# Patient Record
Sex: Female | Born: 1970 | Race: White | Marital: Single | Smoking: Former smoker
Health system: Southern US, Community
[De-identification: ages and names within clinical notes are randomized; demographics above are authoritative.]

## PROBLEM LIST (undated history)

## (undated) DIAGNOSIS — R569 Unspecified convulsions: Secondary | ICD-10-CM

## (undated) DIAGNOSIS — Z86718 Personal history of other venous thrombosis and embolism: Secondary | ICD-10-CM

## (undated) DIAGNOSIS — M1990 Unspecified osteoarthritis, unspecified site: Secondary | ICD-10-CM

## (undated) DIAGNOSIS — G35 Multiple sclerosis: Secondary | ICD-10-CM

## (undated) HISTORY — PX: CARPAL TUNNEL RELEASE: SHX101

## (undated) HISTORY — PX: CHOLECYSTECTOMY: SHX55

## (undated) HISTORY — PX: BACK SURGERY: SHX140

---

## 2013-05-10 ENCOUNTER — Emergency Department: Payer: Self-pay | Admitting: Emergency Medicine

## 2013-05-10 LAB — URINALYSIS, COMPLETE
Blood: NEGATIVE
Glucose,UR: NEGATIVE mg/dL (ref 0–75)
Ketone: NEGATIVE
Nitrite: NEGATIVE
Ph: 6 (ref 4.5–8.0)
Protein: NEGATIVE
RBC,UR: 6 /HPF (ref 0–5)
Specific Gravity: 1.03 (ref 1.003–1.030)
Squamous Epithelial: 7
WBC UR: 4 /HPF (ref 0–5)

## 2013-05-10 LAB — COMPREHENSIVE METABOLIC PANEL
Albumin: 3.1 g/dL — ABNORMAL LOW (ref 3.4–5.0)
Alkaline Phosphatase: 95 U/L (ref 50–136)
Calcium, Total: 9.3 mg/dL (ref 8.5–10.1)
Chloride: 106 mmol/L (ref 98–107)
Creatinine: 0.94 mg/dL (ref 0.60–1.30)
EGFR (African American): >60
EGFR (Non-African Amer.): >60
Osmolality: 279 (ref 275–301)
SGOT(AST): 24 U/L (ref 15–37)
SGPT (ALT): 28 U/L (ref 12–78)

## 2013-05-10 LAB — PREGNANCY, URINE: Pregnancy Test, Urine: NEGATIVE m[IU]/mL

## 2013-05-10 LAB — CBC
HGB: 13.8 g/dL (ref 12.0–16.0)
MCH: 30.7 pg (ref 26.0–34.0)
MCHC: 33.9 g/dL (ref 32.0–36.0)
MCV: 90 fL (ref 80–100)
Platelet: 166 10*3/uL (ref 150–440)
RBC: 4.51 10*6/uL (ref 3.80–5.20)
RDW: 14.3 % (ref 11.5–14.5)

## 2014-02-03 ENCOUNTER — Emergency Department (HOSPITAL_COMMUNITY)
Admission: EM | Admit: 2014-02-03 | Discharge: 2014-02-03 | Disposition: A | Discharge status: Home / Self Care | Payer: Self-pay | Attending: Emergency Medicine | Admitting: Emergency Medicine

## 2014-02-03 ENCOUNTER — Encounter (HOSPITAL_COMMUNITY): Payer: Self-pay | Admitting: Emergency Medicine

## 2014-02-03 DIAGNOSIS — Z79899 Other long term (current) drug therapy: Secondary | ICD-10-CM | POA: Insufficient documentation

## 2014-02-03 DIAGNOSIS — Z86718 Personal history of other venous thrombosis and embolism: Secondary | ICD-10-CM | POA: Insufficient documentation

## 2014-02-03 DIAGNOSIS — R109 Unspecified abdominal pain: Secondary | ICD-10-CM | POA: Insufficient documentation

## 2014-02-03 DIAGNOSIS — R197 Diarrhea, unspecified: Secondary | ICD-10-CM | POA: Insufficient documentation

## 2014-02-03 DIAGNOSIS — G40909 Epilepsy, unspecified, not intractable, without status epilepticus: Secondary | ICD-10-CM | POA: Insufficient documentation

## 2014-02-03 DIAGNOSIS — Z7901 Long term (current) use of anticoagulants: Secondary | ICD-10-CM | POA: Insufficient documentation

## 2014-02-03 DIAGNOSIS — R1110 Vomiting, unspecified: Secondary | ICD-10-CM | POA: Insufficient documentation

## 2014-02-03 DIAGNOSIS — M129 Arthropathy, unspecified: Secondary | ICD-10-CM | POA: Insufficient documentation

## 2014-02-03 DIAGNOSIS — Z87891 Personal history of nicotine dependence: Secondary | ICD-10-CM | POA: Insufficient documentation

## 2014-02-03 HISTORY — DX: Unspecified convulsions: R56.9

## 2014-02-03 HISTORY — DX: Multiple sclerosis: G35

## 2014-02-03 HISTORY — DX: Unspecified osteoarthritis, unspecified site: M19.90

## 2014-02-03 HISTORY — DX: Personal history of other venous thrombosis and embolism: Z86.718

## 2014-02-03 LAB — COMPREHENSIVE METABOLIC PANEL
ALBUMIN: 3.2 g/dL — AB (ref 3.5–5.2)
ALT: 23 U/L (ref 0–35)
AST: 17 U/L (ref 0–37)
Alkaline Phosphatase: 79 U/L (ref 39–117)
BUN: 12 mg/dL (ref 6–23)
CHLORIDE: 105 meq/L (ref 96–112)
CO2: 23 mEq/L (ref 19–32)
Calcium: 9.4 mg/dL (ref 8.4–10.5)
Creatinine, Ser: 0.7 mg/dL (ref 0.50–1.10)
GFR calc Af Amer: >90 mL/min (ref >90)
GFR calc non Af Amer: >90 mL/min (ref >90)
Glucose, Bld: 99 mg/dL (ref 70–99)
POTASSIUM: 4.1 meq/L (ref 3.7–5.3)
SODIUM: 142 meq/L (ref 137–147)
TOTAL PROTEIN: 6.6 g/dL (ref 6.0–8.3)

## 2014-02-03 LAB — URINALYSIS, ROUTINE W REFLEX MICROSCOPIC
BILIRUBIN URINE: NEGATIVE
GLUCOSE, UA: NEGATIVE mg/dL
HGB URINE DIPSTICK: NEGATIVE
KETONES UR: NEGATIVE mg/dL
Leukocytes, UA: NEGATIVE
NITRITE: NEGATIVE
Protein, ur: NEGATIVE mg/dL
SPECIFIC GRAVITY, URINE: 1.024 (ref 1.005–1.030)
Urobilinogen, UA: 0.2 mg/dL (ref 0.0–1.0)
pH: 5.5 (ref 5.0–8.0)

## 2014-02-03 LAB — CBC WITH DIFFERENTIAL/PLATELET
BASOS PCT: 0 % (ref 0–1)
Basophils Absolute: 0 10*3/uL (ref 0.0–0.1)
EOS ABS: 0.1 10*3/uL (ref 0.0–0.7)
Eosinophils Relative: 1 % (ref 0–5)
HCT: 39.3 % (ref 36.0–46.0)
HEMOGLOBIN: 13.2 g/dL (ref 12.0–15.0)
Lymphocytes Relative: 39 % (ref 12–46)
Lymphs Abs: 3.2 10*3/uL (ref 0.7–4.0)
MCH: 30.6 pg (ref 26.0–34.0)
MCHC: 33.6 g/dL (ref 30.0–36.0)
MCV: 91.2 fL (ref 78.0–100.0)
MONOS PCT: 7 % (ref 3–12)
Monocytes Absolute: 0.5 10*3/uL (ref 0.1–1.0)
NEUTROS PCT: 53 % (ref 43–77)
Neutro Abs: 4.4 10*3/uL (ref 1.7–7.7)
Platelets: 173 10*3/uL (ref 150–400)
RBC: 4.31 MIL/uL (ref 3.87–5.11)
RDW: 13.9 % (ref 11.5–15.5)
WBC: 8.3 10*3/uL (ref 4.0–10.5)

## 2014-02-03 LAB — PROTIME-INR
INR: 0.97 (ref 0.00–1.49)
Prothrombin Time: 12.7 seconds (ref 11.6–15.2)

## 2014-02-03 LAB — PHENYTOIN LEVEL, TOTAL

## 2014-02-03 LAB — LIPASE, BLOOD: Lipase: 25 U/L (ref 11–59)

## 2014-02-03 MED ORDER — ONDANSETRON HCL 4 MG/2ML IJ SOLN
4.0000 mg | Freq: Once | INTRAMUSCULAR | Status: AC
  Administered 2014-02-03: 4 mg via INTRAVENOUS
  Filled 2014-02-03: qty 2

## 2014-02-03 MED ORDER — PROMETHAZINE HCL 25 MG PO TABS: 25.0000 mg | ORAL_TABLET | Freq: Four times a day (QID) | ORAL | Status: DC | PRN

## 2014-02-03 MED ORDER — FENTANYL CITRATE 0.05 MG/ML IJ SOLN
100.0000 ug | Freq: Once | INTRAMUSCULAR | Status: AC
  Administered 2014-02-03: 100 ug via INTRAVENOUS
  Filled 2014-02-03: qty 2

## 2014-02-03 MED ORDER — SODIUM CHLORIDE 0.9 % IV BOLUS (SEPSIS)
1000.0000 mL | Freq: Once | INTRAVENOUS | Status: AC
  Administered 2014-02-03: 1000 mL via INTRAVENOUS

## 2014-02-03 MED ORDER — HYDROCODONE-ACETAMINOPHEN 5-325 MG PO TABS: 2.0000 | ORAL_TABLET | ORAL | Status: DC | PRN

## 2014-02-03 MED ORDER — SODIUM CHLORIDE 0.9 % IV SOLN
1000.0000 mg | Freq: Once | INTRAVENOUS | Status: AC
  Administered 2014-02-03: 1000 mg via INTRAVENOUS
  Filled 2014-02-03: qty 20

## 2014-02-03 MED ORDER — ENOXAPARIN SODIUM 100 MG/ML ~~LOC~~ SOLN
100.0000 mg | Freq: Once | SUBCUTANEOUS | Status: AC
  Administered 2014-02-03: 100 mg via SUBCUTANEOUS
  Filled 2014-02-03: qty 1

## 2014-02-03 NOTE — ED Notes (Signed)
Pt c/o vomiting and diarrhea x 3 days and intermittent abdominal cramping x 2 days.  Pt takes dilantin and coumadin and has been unable to take her meds for 3 days.  Pt also has been unable to take her pain meds that she takes for her arthritis.

## 2014-02-03 NOTE — ED Provider Notes (Signed)
CSN: [id number]     Arrival date & time 02/03/14  1634 History   First MD Initiated Contact with Patient 02/03/14 1650     Chief Complaint  Patient presents with  . abdominal pain/diarrehea/emesis     HPI Pt c/o vomiting and diarrhea x 3 days and intermittent abdominal cramping x 2 days. Pt takes dilantin and coumadin and has been unable to take her meds for 3 days. Pt also has been unable to take her pain meds that she takes for her arthritis  Past Medical History  Diagnosis Date  . H/O blood clots   . Seizures   . Arthritis   . MS (multiple sclerosis)    Past Surgical History  Procedure Laterality Date  . Back surgery    . Cholecystectomy    . Carpal tunnel release     No family history on file. History  Substance Use Topics  . Smoking status: Former Games developermoker  . Smokeless tobacco: Not on file  . Alcohol Use: No   OB History   Grav Para Term Preterm Abortions TAB SAB Ect Mult Living                 Review of Systems All other systems reviewed and are negative Allergies  Tramadol and Keflex  Home Medications   Current Outpatient Rx  Name  Route  Sig  Dispense  Refill  . cyclobenzaprine (FLEXERIL) 10 MG tablet   Oral   Take 10 mg by mouth 3 (three) times daily as needed for muscle spasms.         Marland Kitchen. oxyCODONE-acetaminophen (PERCOCET/ROXICET) 5-325 MG per tablet   Oral   Take 1 tablet by mouth every 4 (four) hours as needed for severe pain.         . phenytoin (DILANTIN) 100 MG ER capsule   Oral   Take 300 mg by mouth 2 (two) times daily.         Marland Kitchen. warfarin (COUMADIN) 2 MG tablet   Oral   Take 6 mg by mouth daily. Pt takes the 2 mg tablets.  Takes 3 tablets for 6 mg dose         . HYDROcodone-acetaminophen (NORCO/VICODIN) 5-325 MG per tablet   Oral   Take 2 tablets by mouth every 4 (four) hours as needed.   10 tablet   0   . promethazine (PHENERGAN) 25 MG tablet   Oral   Take 1 tablet (25 mg total) by mouth every 6 (six) hours as needed for nausea  or vomiting.   30 tablet   0    BP 99/58  Pulse 78  Temp(Src) 98.4 F (36.9 C) (Oral)  Resp 25  Wt 230 lb 6 oz (104.497 kg)  SpO2 98%  LMP 01/15/2014 Physical Exam  Nursing note and vitals reviewed. Constitutional: She is oriented to person, place, and time. She appears well-developed and well-nourished. No distress.  HENT:  Head: Normocephalic and atraumatic.  Eyes: Pupils are equal, round, and reactive to light.  Neck: Normal range of motion.  Cardiovascular: Normal rate and intact distal pulses.   Pulmonary/Chest: No respiratory distress.  Abdominal: Normal appearance. She exhibits no distension. There is no tenderness. There is no rebound and no guarding.  Musculoskeletal: Normal range of motion.  Neurological: She is alert and oriented to person, place, and time. No cranial nerve deficit.  Skin: Skin is warm and dry. No rash noted.  Psychiatric: She has a normal mood and affect. Her behavior is  normal.    ED Course  Procedures (including critical care time)  Medications  fentaNYL (SUBLIMAZE) injection 100 mcg (100 mcg Intravenous Given 02/03/14 1733)  ondansetron (ZOFRAN) injection 4 mg (4 mg Intravenous Given 02/03/14 1733)  sodium chloride 0.9 % bolus 1,000 mL (0 mLs Intravenous Stopped 02/03/14 1831)  fentaNYL (SUBLIMAZE) injection 100 mcg (100 mcg Intravenous Given 02/03/14 1845)  phenytoin (DILANTIN) 1,000 mg in sodium chloride 0.9 % 250 mL IVPB (0 mg Intravenous Stopped 02/03/14 1958)  enoxaparin (LOVENOX) injection 100 mg (100 mg Subcutaneous Given 02/03/14 2025)    Labs Review Labs Reviewed  COMPREHENSIVE METABOLIC PANEL - Abnormal; Notable for the following:    Albumin 3.2 (*)    Total Bilirubin <0.2 (*)    All other components within normal limits  PHENYTOIN LEVEL, TOTAL - Abnormal; Notable for the following:    Phenytoin Lvl <2.5 (*)    All other components within normal limits  CBC WITH DIFFERENTIAL  LIPASE, BLOOD  PROTIME-INR  URINALYSIS, ROUTINE W REFLEX  MICROSCOPIC   Imaging Review No results found.  Patient refused to be admitted to the hospital.  She wants to go home and will return if her vomiting continues.  We did load her with Dilantin and treat her with Lovenox prior to discharge.  I did prescribe Phenergan for her nausea.  Her labs look unremarkable and hopefully this is a gastroenteritis..  I invited her to return to the emergency room for admission if her symptoms persist or vomiting can be controlled.  In  MDM   1. Vomiting   2. Seizure disorder        Nelia Shi, MD 02/03/14 2129

## 2014-02-03 NOTE — Discharge Instructions (Signed)
Nausea and Vomiting °Nausea means you feel sick to your stomach. Throwing up (vomiting) is a reflex where stomach contents come out of your mouth. °HOME CARE  °· Take medicine as told by your doctor. °· Do not force yourself to eat. However, you do need to drink fluids. °· If you feel like eating, eat a normal diet as told by your doctor. °· Eat rice, wheat, potatoes, bread, lean meats, yogurt, fruits, and vegetables. °· Avoid high-fat foods. °· Drink enough fluids to keep your pee (urine) clear or pale yellow. °· Ask your doctor how to replace body fluid losses (rehydrate). Signs of body fluid loss (dehydration) include: °· Feeling very thirsty. °· Dry lips and mouth. °· Feeling dizzy. °· Dark pee. °· Peeing less than normal. °· Feeling confused. °· Fast breathing or heart rate. °GET HELP RIGHT AWAY IF:  °· You have blood in your throw up. °· You have black or bloody poop (stool). °· You have a bad headache or stiff neck. °· You feel confused. °· You have bad belly (abdominal) pain. °· You have chest pain or trouble breathing. °· You do not pee at least once every 8 hours. °· You have cold, clammy skin. °· You keep throwing up after 24 to 48 hours. °· You have a fever. °MAKE SURE YOU:  °· Understand these instructions. °· Will watch your condition. °· Will get help right away if you are not doing well or get worse. °Document Released: 06/02/2008 Document Revised: 03/08/2012 Document Reviewed: 05/16/2011 °[employer]® Patient Information ©2014 [employer]. ° °

## 2014-02-19 ENCOUNTER — Encounter (HOSPITAL_COMMUNITY): Payer: Self-pay | Admitting: Emergency Medicine

## 2014-02-19 ENCOUNTER — Emergency Department (HOSPITAL_COMMUNITY)
Admission: EM | Admit: 2014-02-19 | Discharge: 2014-02-19 | Disposition: A | Discharge status: Home / Self Care | Payer: Self-pay | Attending: Emergency Medicine | Admitting: Emergency Medicine

## 2014-02-19 DIAGNOSIS — Z79899 Other long term (current) drug therapy: Secondary | ICD-10-CM | POA: Insufficient documentation

## 2014-02-19 DIAGNOSIS — N949 Unspecified condition associated with female genital organs and menstrual cycle: Secondary | ICD-10-CM | POA: Insufficient documentation

## 2014-02-19 DIAGNOSIS — M129 Arthropathy, unspecified: Secondary | ICD-10-CM | POA: Insufficient documentation

## 2014-02-19 DIAGNOSIS — Z3202 Encounter for pregnancy test, result negative: Secondary | ICD-10-CM | POA: Insufficient documentation

## 2014-02-19 DIAGNOSIS — R102 Pelvic and perineal pain: Secondary | ICD-10-CM

## 2014-02-19 DIAGNOSIS — Z87891 Personal history of nicotine dependence: Secondary | ICD-10-CM | POA: Insufficient documentation

## 2014-02-19 DIAGNOSIS — Z7901 Long term (current) use of anticoagulants: Secondary | ICD-10-CM | POA: Insufficient documentation

## 2014-02-19 DIAGNOSIS — Z86718 Personal history of other venous thrombosis and embolism: Secondary | ICD-10-CM | POA: Insufficient documentation

## 2014-02-19 DIAGNOSIS — R112 Nausea with vomiting, unspecified: Secondary | ICD-10-CM | POA: Insufficient documentation

## 2014-02-19 DIAGNOSIS — G40909 Epilepsy, unspecified, not intractable, without status epilepticus: Secondary | ICD-10-CM | POA: Insufficient documentation

## 2014-02-19 DIAGNOSIS — N938 Other specified abnormal uterine and vaginal bleeding: Secondary | ICD-10-CM | POA: Insufficient documentation

## 2014-02-19 DIAGNOSIS — N925 Other specified irregular menstruation: Secondary | ICD-10-CM | POA: Insufficient documentation

## 2014-02-19 LAB — CBC
HCT: 39.3 % (ref 36.0–46.0)
Hemoglobin: 13.5 g/dL (ref 12.0–15.0)
MCH: 31.2 pg (ref 26.0–34.0)
MCHC: 34.4 g/dL (ref 30.0–36.0)
MCV: 90.8 fL (ref 78.0–100.0)
PLATELETS: 144 10*3/uL — AB (ref 150–400)
RBC: 4.33 MIL/uL (ref 3.87–5.11)
RDW: 14.2 % (ref 11.5–15.5)
WBC: 6.3 10*3/uL (ref 4.0–10.5)

## 2014-02-19 LAB — BASIC METABOLIC PANEL
BUN: 10 mg/dL (ref 6–23)
CALCIUM: 8.9 mg/dL (ref 8.4–10.5)
CHLORIDE: 104 meq/L (ref 96–112)
CO2: 24 mEq/L (ref 19–32)
CREATININE: 0.7 mg/dL (ref 0.50–1.10)
GFR calc Af Amer: >90 mL/min (ref >90)
GFR calc non Af Amer: >90 mL/min (ref >90)
Glucose, Bld: 91 mg/dL (ref 70–99)
Potassium: 4.3 mEq/L (ref 3.7–5.3)
SODIUM: 141 meq/L (ref 137–147)

## 2014-02-19 LAB — URINALYSIS, ROUTINE W REFLEX MICROSCOPIC
Bilirubin Urine: NEGATIVE
Glucose, UA: NEGATIVE mg/dL
Ketones, ur: NEGATIVE mg/dL
NITRITE: NEGATIVE
PROTEIN: NEGATIVE mg/dL
SPECIFIC GRAVITY, URINE: 1.021 (ref 1.005–1.030)
UROBILINOGEN UA: 0.2 mg/dL (ref 0.0–1.0)
pH: 6.5 (ref 5.0–8.0)

## 2014-02-19 LAB — POC URINE PREG, ED: PREG TEST UR: NEGATIVE

## 2014-02-19 LAB — URINE MICROSCOPIC-ADD ON

## 2014-02-19 LAB — WET PREP, GENITAL
Clue Cells Wet Prep HPF POC: NONE SEEN
Trich, Wet Prep: NONE SEEN
Yeast Wet Prep HPF POC: NONE SEEN

## 2014-02-19 MED ORDER — MORPHINE SULFATE 4 MG/ML IJ SOLN
4.0000 mg | Freq: Once | INTRAMUSCULAR | Status: AC
  Administered 2014-02-19: 4 mg via INTRAVENOUS
  Filled 2014-02-19: qty 1

## 2014-02-19 MED ORDER — ONDANSETRON HCL 4 MG/2ML IJ SOLN
4.0000 mg | Freq: Once | INTRAMUSCULAR | Status: AC
  Administered 2014-02-19: 4 mg via INTRAVENOUS
  Filled 2014-02-19: qty 2

## 2014-02-19 MED ORDER — HYDROCODONE-ACETAMINOPHEN 5-325 MG PO TABS: 1.0000 | ORAL_TABLET | ORAL | Status: DC | PRN

## 2014-02-19 MED ORDER — HYDROMORPHONE HCL PF 1 MG/ML IJ SOLN
1.0000 mg | Freq: Once | INTRAMUSCULAR | Status: DC
  Filled 2014-02-19: qty 1

## 2014-02-19 MED ORDER — HYDROCODONE-ACETAMINOPHEN 5-325 MG PO TABS
1.0000 | ORAL_TABLET | Freq: Once | ORAL | Status: AC
  Administered 2014-02-19: 1 via ORAL
  Filled 2014-02-19: qty 1

## 2014-02-19 NOTE — Discharge Instructions (Signed)
Call for a follow up appointment with a Family or Primary Care Provider.  Call an OB/GYN for further evaluation of your ovarian cyst and pelvic pain. Return if Symptoms worsen.   Take medication as prescribed.

## 2014-02-19 NOTE — ED Notes (Signed)
Per pt sts severe LLQ pain that started yesterday with her period,. sts hx of ovarian cysts. sts some vomiting

## 2014-02-19 NOTE — ED Notes (Signed)
Pt refused vitals. D/c her own IV. States she needs to leave.

## 2014-02-19 NOTE — ED Provider Notes (Signed)
CSN: [id number]     Arrival date & time 02/19/14  1456 History   First MD Initiated Contact with Patient 02/19/14 1519     Chief Complaint  Patient presents with  . Abdominal Pain     (Consider location/radiation/quality/duration/timing/severity/associated sxs/prior Treatment) HPI Comments: Heather Odom is a 43 year-old female with a past medical history of ovarian cysts, Arthritis, MS and seizure disorder, presenting the Emergency Department with a chief complaint of LLQ pain since yesterday.  The patient reports sharp, non-radiating, constant discomfort in the left lower quadrant.  She reports associated nausea with emesis today.  She reports non-bloody BM today.  She reports LNMP 02/18/2014.  She reports similar pain with ovarian cysts in the past.  NO PCP   The history is provided by the patient and medical records. No language interpreter was used.    Past Medical History  Diagnosis Date  . H/O blood clots   . Seizures   . Arthritis   . MS (multiple sclerosis)    Past Surgical History  Procedure Laterality Date  . Back surgery    . Cholecystectomy    . Carpal tunnel release     History reviewed. No pertinent family history. History  Substance Use Topics  . Smoking status: Former Games developer  . Smokeless tobacco: Not on file  . Alcohol Use: No   OB History   Grav Para Term Preterm Abortions TAB SAB Ect Mult Living                 Review of Systems  Constitutional: Negative for fever and chills.  Gastrointestinal: Positive for nausea, vomiting and abdominal pain. Negative for diarrhea and constipation.  Genitourinary: Positive for menstrual problem and pelvic pain. Negative for dysuria, hematuria and difficulty urinating.      Allergies  Shellfish allergy; Tramadol; and Keflex  Home Medications   Current Outpatient Rx  Name  Route  Sig  Dispense  Refill  . cyclobenzaprine (FLEXERIL) 10 MG tablet   Oral   Take 10 mg by mouth 3 (three) times daily as needed for  muscle spasms.         Marland Kitchen HYDROcodone-acetaminophen (NORCO/VICODIN) 5-325 MG per tablet   Oral   Take 1 tablet by mouth every 6 (six) hours as needed for moderate pain.         Marland Kitchen ibuprofen (ADVIL,MOTRIN) 200 MG tablet   Oral   Take 400 mg by mouth every 6 (six) hours as needed for mild pain.         Marland Kitchen oxyCODONE-acetaminophen (PERCOCET/ROXICET) 5-325 MG per tablet   Oral   Take 1 tablet by mouth every 4 (four) hours as needed for severe pain.         . phenytoin (DILANTIN) 100 MG ER capsule   Oral   Take 300 mg by mouth 2 (two) times daily.         . promethazine (PHENERGAN) 25 MG tablet   Oral   Take 25 mg by mouth every 6 (six) hours as needed for nausea or vomiting.         . warfarin (COUMADIN) 2 MG tablet   Oral   Take 6 mg by mouth daily.          Marland Kitchen HYDROcodone-acetaminophen (NORCO/VICODIN) 5-325 MG per tablet   Oral   Take 1 tablet by mouth every 4 (four) hours as needed.   10 tablet   0    BP 114/74  Pulse 66  Temp(Src) 99 F (  37.2 C) (Oral)  Resp 20  SpO2 97%  LMP 02/19/2014 Physical Exam  Nursing note and vitals reviewed. Constitutional: She is oriented to person, place, and time. She appears well-developed and well-nourished. No distress.  Exam limited by patient's body habitus.    HENT:  Head: Normocephalic and atraumatic.  Eyes: EOM are normal. Pupils are equal, round, and reactive to light. No scleral icterus.  Neck: Neck supple.  Cardiovascular: Normal rate, regular rhythm and normal heart sounds.   No murmur heard. Pulmonary/Chest: Effort normal and breath sounds normal. No respiratory distress. She has no decreased breath sounds. She has no wheezes. She has no rhonchi.  Abdominal: Soft. Bowel sounds are normal. There is tenderness in the left lower quadrant. There is no rebound, no guarding and no CVA tenderness.  Genitourinary: Uterus is not tender. Cervix exhibits no motion tenderness. Right adnexum displays no tenderness. Left  adnexum displays tenderness.  Moderate amount of dark blood in the posterior vaginal vault. Left adnexa with moderate tenderness to palpation no obvious mass.  Musculoskeletal: Normal range of motion. She exhibits no edema.  Neurological: She is alert and oriented to person, place, and time.  Skin: Skin is warm and dry. No rash noted.  Psychiatric: She has a normal mood and affect.    ED Course  Procedures (including critical care time) Labs Review Labs Reviewed  WET PREP, GENITAL - Abnormal; Notable for the following:    WBC, Wet Prep HPF POC FEW (*)    All other components within normal limits  URINALYSIS, ROUTINE W REFLEX MICROSCOPIC - Abnormal; Notable for the following:    APPearance CLOUDY (*)    Hgb urine dipstick LARGE (*)    Leukocytes, UA SMALL (*)    All other components within normal limits  CBC - Abnormal; Notable for the following:    Platelets 144 (*)    All other components within normal limits  GC/CHLAMYDIA PROBE AMP  BASIC METABOLIC PANEL  URINE MICROSCOPIC-ADD ON  POC URINE PREG, ED   Imaging Review No results found.  EKG Interpretation   None       MDM   Final diagnoses:  Pelvic pain   Pt with a history of pelvic pain presents with LLQ pain.  Negative pregnancy test. Afebrile, no leukocytois doubt diverticulitis at this time. Doubt ovarian torsion at this time. UA shows Hgb, currently menstruating. Pelvic exam without adnexa mass.  BMP WNL.  Re-eval: Pt reports some relief with morphine.  Morphine 4mg  #2 ordered.  Wet prep without yeast, trich, clue cells. Pain likely from cysts, pain control and follow up with OB/GYN. Discussed lab results, and treatment plan with the patient. Return precautions given. Reports understanding and no other concerns at this time.  Patient is stable for discharge at this time.  Meds given in ED:  Medications  HYDROmorphone (DILAUDID) injection 1 mg (not administered)  morphine 4 MG/ML injection 4 mg (4 mg Intravenous  Given 02/19/14 1623)  ondansetron (ZOFRAN) injection 4 mg (4 mg Intravenous Given 02/19/14 1623)  morphine 4 MG/ML injection 4 mg (4 mg Intravenous Given 02/19/14 1722)    New Prescriptions   HYDROCODONE-ACETAMINOPHEN (NORCO/VICODIN) 5-325 MG PER TABLET    Take 1 tablet by mouth every 4 (four) hours as needed.      Clabe SealLauren M Oreatha Fabry, PA-C 02/19/14 1807

## 2014-02-19 NOTE — ED Notes (Signed)
PA at bedside.

## 2014-02-19 NOTE — ED Notes (Signed)
Pt reports she has a h/o ovarian cysts and the pain is similar.

## 2014-02-20 LAB — GC/CHLAMYDIA PROBE AMP
CT PROBE, AMP APTIMA: NEGATIVE
GC PROBE AMP APTIMA: NEGATIVE

## 2014-02-20 NOTE — ED Provider Notes (Signed)
Medical screening examination/treatment/procedure(s) were conducted as a shared visit with non-physician practitioner(s) and myself.  I personally evaluated the patient during the encounter.  EKG Interpretation   None       Pt states hx ovarian cysts, c/o right lower abd/pelvis pain c/w prior cyst related pain. No fever or chills. abd soft mild llq tenderness. No rebound or guarding. Labs. Pelvic exam per PA.   Suzi RootsKevin E Makell Cyr, MD 02/20/14 1153

## 2014-03-02 ENCOUNTER — Ambulatory Visit (INDEPENDENT_AMBULATORY_CARE_PROVIDER_SITE_OTHER): Payer: Self-pay | Admitting: Obstetrics & Gynecology

## 2014-03-02 ENCOUNTER — Encounter: Payer: Self-pay | Admitting: Obstetrics & Gynecology

## 2014-03-02 VITALS — BP 127/81 | HR 97 | Temp 98.1°F | Ht 62.0 in | Wt 233.4 lb

## 2014-03-02 DIAGNOSIS — R102 Pelvic and perineal pain: Secondary | ICD-10-CM

## 2014-03-02 DIAGNOSIS — N949 Unspecified condition associated with female genital organs and menstrual cycle: Secondary | ICD-10-CM

## 2014-03-02 NOTE — Patient Instructions (Signed)

## 2014-03-02 NOTE — Progress Notes (Signed)
Subjective:     Patient ID: Heather Odom, female   DOB: 04/26/1971, 43 y.o.   MRN: [id number]  HPI Pt was seen in the ED for pelvic pain.  She reported to them that she was previously told that she needed a partial hysterectomy for ovarian cysts'.  She reports that the pain that she had in theED has resolved. She does report heavy menses with no Intermenstrual bleeding.  Past Medical History  Diagnosis Date  . H/O blood clots   . Seizures   . Arthritis   . MS (multiple sclerosis)    Past Surgical History  Procedure Laterality Date  . Back surgery    . Cholecystectomy    . Carpal tunnel release     Current Outpatient Prescriptions on File Prior to Visit  Medication Sig Dispense Refill  . cyclobenzaprine (FLEXERIL) 10 MG tablet Take 10 mg by mouth 3 (three) times daily as needed for muscle spasms.      Marland Kitchen. HYDROcodone-acetaminophen (NORCO/VICODIN) 5-325 MG per tablet Take 1 tablet by mouth every 4 (four) hours as needed.  10 tablet  0  . ibuprofen (ADVIL,MOTRIN) 200 MG tablet Take 400 mg by mouth every 6 (six) hours as needed for mild pain.      Marland Kitchen. oxyCODONE-acetaminophen (PERCOCET/ROXICET) 5-325 MG per tablet Take 1 tablet by mouth every 4 (four) hours as needed for severe pain.      . phenytoin (DILANTIN) 100 MG ER capsule Take 300 mg by mouth 2 (two) times daily.      Marland Kitchen. warfarin (COUMADIN) 2 MG tablet Take 6 mg by mouth daily.        No current facility-administered medications on file prior to visit.   Allergies  Allergen Reactions  . Shellfish Allergy Anaphylaxis  . Tramadol Nausea And Vomiting  . Keflex [Cephalexin] Rash        Review of Systems     Objective:   Physical Exam BP 127/81  Pulse 97  Temp(Src) 98.1 F (36.7 C) (Oral)  Ht 5\' 2"  (1.575 m)  Wt 233 lb 6.4 oz (105.87 kg)  BMI 42.68 kg/m2  LMP 02/19/2014 Pt in NAD Abd: obese, NT; ND GU: EGBUS: no lesions Vagina: no blood in vault Cervix: no lesion; no mucopurulent d/c Uterus: small, mobile Adnexa: no  masses; non tender- exam limited by body habitus    CBC    Component Value Date/Time   WBC 6.3 02/19/2014 1600   RBC 4.33 02/19/2014 1600   HGB 13.5 02/19/2014 1600   HCT 39.3 02/19/2014 1600   PLT 144* 02/19/2014 1600   MCV 90.8 02/19/2014 1600   MCH 31.2 02/19/2014 1600   MCHC 34.4 02/19/2014 1600   RDW 14.2 02/19/2014 1600   LYMPHSABS 3.2 02/03/2014 1735   MONOABS 0.5 02/03/2014 1735   EOSABS 0.1 02/03/2014 1735   BASOSABS 0.0 02/03/2014 1735          Assessment:     Pelvic pain 02/19/2014 which has resolved completely.     Plan:     F/u in 1 year or sooner prn

## 2014-04-11 ENCOUNTER — Encounter (HOSPITAL_COMMUNITY): Payer: Self-pay | Admitting: Emergency Medicine

## 2014-04-11 ENCOUNTER — Emergency Department (HOSPITAL_COMMUNITY)
Admission: EM | Admit: 2014-04-11 | Discharge: 2014-04-11 | Disposition: A | Discharge status: Home / Self Care | Payer: Self-pay | Attending: Emergency Medicine | Admitting: Emergency Medicine

## 2014-04-11 ENCOUNTER — Emergency Department (HOSPITAL_COMMUNITY): Payer: Self-pay

## 2014-04-11 DIAGNOSIS — G40909 Epilepsy, unspecified, not intractable, without status epilepticus: Secondary | ICD-10-CM | POA: Insufficient documentation

## 2014-04-11 DIAGNOSIS — Z79899 Other long term (current) drug therapy: Secondary | ICD-10-CM | POA: Insufficient documentation

## 2014-04-11 DIAGNOSIS — Z7901 Long term (current) use of anticoagulants: Secondary | ICD-10-CM | POA: Insufficient documentation

## 2014-04-11 DIAGNOSIS — N83209 Unspecified ovarian cyst, unspecified side: Secondary | ICD-10-CM | POA: Insufficient documentation

## 2014-04-11 DIAGNOSIS — Z8739 Personal history of other diseases of the musculoskeletal system and connective tissue: Secondary | ICD-10-CM | POA: Insufficient documentation

## 2014-04-11 DIAGNOSIS — Z3202 Encounter for pregnancy test, result negative: Secondary | ICD-10-CM | POA: Insufficient documentation

## 2014-04-11 DIAGNOSIS — Z87891 Personal history of nicotine dependence: Secondary | ICD-10-CM | POA: Insufficient documentation

## 2014-04-11 DIAGNOSIS — Z86718 Personal history of other venous thrombosis and embolism: Secondary | ICD-10-CM | POA: Insufficient documentation

## 2014-04-11 LAB — CBC WITH DIFFERENTIAL/PLATELET
BASOS PCT: 0 % (ref 0–1)
Basophils Absolute: 0 10*3/uL (ref 0.0–0.1)
Eosinophils Absolute: 0.2 10*3/uL (ref 0.0–0.7)
Eosinophils Relative: 2 % (ref 0–5)
HCT: 39.7 % (ref 36.0–46.0)
Hemoglobin: 13.6 g/dL (ref 12.0–15.0)
LYMPHS ABS: 4.1 10*3/uL — AB (ref 0.7–4.0)
Lymphocytes Relative: 44 % (ref 12–46)
MCH: 31.2 pg (ref 26.0–34.0)
MCHC: 34.3 g/dL (ref 30.0–36.0)
MCV: 91.1 fL (ref 78.0–100.0)
MONOS PCT: 6 % (ref 3–12)
Monocytes Absolute: 0.6 10*3/uL (ref 0.1–1.0)
Neutro Abs: 4.5 10*3/uL (ref 1.7–7.7)
Neutrophils Relative %: 48 % (ref 43–77)
Platelets: 179 10*3/uL (ref 150–400)
RBC: 4.36 MIL/uL (ref 3.87–5.11)
RDW: 13.9 % (ref 11.5–15.5)
WBC: 9.3 10*3/uL (ref 4.0–10.5)

## 2014-04-11 LAB — URINALYSIS, ROUTINE W REFLEX MICROSCOPIC
Bilirubin Urine: NEGATIVE
GLUCOSE, UA: NEGATIVE mg/dL
KETONES UR: NEGATIVE mg/dL
LEUKOCYTES UA: NEGATIVE
NITRITE: NEGATIVE
PROTEIN: NEGATIVE mg/dL
Specific Gravity, Urine: 1.022 (ref 1.005–1.030)
UROBILINOGEN UA: 0.2 mg/dL (ref 0.0–1.0)
pH: 6 (ref 5.0–8.0)

## 2014-04-11 LAB — COMPREHENSIVE METABOLIC PANEL
ALT: 16 U/L (ref 0–35)
AST: 17 U/L (ref 0–37)
Albumin: 3.1 g/dL — ABNORMAL LOW (ref 3.5–5.2)
Alkaline Phosphatase: 76 U/L (ref 39–117)
BUN: 13 mg/dL (ref 6–23)
CHLORIDE: 102 meq/L (ref 96–112)
CO2: 26 mEq/L (ref 19–32)
Calcium: 9.2 mg/dL (ref 8.4–10.5)
Creatinine, Ser: 0.82 mg/dL (ref 0.50–1.10)
GFR calc non Af Amer: 88 mL/min — ABNORMAL LOW (ref >90)
GLUCOSE: 105 mg/dL — AB (ref 70–99)
Potassium: 4.3 mEq/L (ref 3.7–5.3)
Sodium: 140 mEq/L (ref 137–147)
TOTAL PROTEIN: 6.6 g/dL (ref 6.0–8.3)

## 2014-04-11 LAB — URINE MICROSCOPIC-ADD ON

## 2014-04-11 LAB — PROTIME-INR
INR: 0.96 (ref 0.00–1.49)
Prothrombin Time: 12.6 seconds (ref 11.6–15.2)

## 2014-04-11 LAB — POC URINE PREG, ED: PREG TEST UR: NEGATIVE

## 2014-04-11 MED ORDER — ONDANSETRON 4 MG PO TBDP: ORAL_TABLET | ORAL | Status: AC

## 2014-04-11 MED ORDER — ONDANSETRON 4 MG PO TBDP
4.0000 mg | ORAL_TABLET | Freq: Once | ORAL | Status: AC
  Administered 2014-04-11: 4 mg via ORAL
  Filled 2014-04-11: qty 1

## 2014-04-11 MED ORDER — HYDROMORPHONE HCL PF 1 MG/ML IJ SOLN
2.0000 mg | Freq: Once | INTRAMUSCULAR | Status: AC
  Administered 2014-04-11: 2 mg via INTRAMUSCULAR
  Filled 2014-04-11: qty 2

## 2014-04-11 MED ORDER — HYDROCODONE-ACETAMINOPHEN 5-325 MG PO TABS: 1.0000 | ORAL_TABLET | ORAL | Status: AC | PRN

## 2014-04-11 NOTE — Discharge Instructions (Signed)

## 2014-04-11 NOTE — ED Notes (Signed)
Pt provided coke and crackers for discharge.

## 2014-04-11 NOTE — ED Provider Notes (Signed)
CSN: [id number]     Arrival date & time 04/11/14  [id number] History   First MD Initiated Contact with Patient 04/11/14 [phone]     Chief Complaint  Patient presents with  . Abdominal Pain     (Consider location/radiation/quality/duration/timing/severity/associated sxs/prior Treatment) HPI Patient has a history of ovarian cysts typically cause symptoms in the beginning of her menstrual cycle. She states she both started her menstrual cycle and started having left-sided pelvic pain yesterday. The pain has been constant and progressive. She's had no fever or chills. Patient does admit to nausea and one episode of vomiting. She's had no diarrhea or constipation. Past Medical History  Diagnosis Date  . H/O blood clots   . Seizures   . Arthritis   . MS (multiple sclerosis)    Past Surgical History  Procedure Laterality Date  . Back surgery    . Cholecystectomy    . Carpal tunnel release     No family history on file. History  Substance Use Topics  . Smoking status: Former Smoker    Types: Cigarettes  . Smokeless tobacco: Not on file  . Alcohol Use: No   OB History   Grav Para Term Preterm Abortions TAB SAB Ect Mult Living   1 1 1  0 0 0 0 0 0 1     Review of Systems  Constitutional: Negative for fever and chills.  Respiratory: Negative for cough and shortness of breath.   Cardiovascular: Negative for chest pain.  Gastrointestinal: Positive for nausea, vomiting and abdominal pain. Negative for diarrhea and constipation.  Genitourinary: Positive for vaginal bleeding and pelvic pain. Negative for dysuria, flank pain and vaginal discharge.  Musculoskeletal: Negative for back pain, neck pain and neck stiffness.  Skin: Negative for rash and wound.  Neurological: Negative for dizziness, seizures, weakness, light-headedness, numbness and headaches.  All other systems reviewed and are negative.     Allergies  Shellfish allergy; Tramadol; and Keflex  Home Medications   Current  Outpatient Rx  Name  Route  Sig  Dispense  Refill  . phenytoin (DILANTIN) 100 MG ER capsule   Oral   Take 300 mg by mouth 2 (two) times daily.         Marland Kitchen. warfarin (COUMADIN) 2 MG tablet   Oral   Take 6 mg by mouth daily.           BP 121/108  Pulse 95  Temp(Src) 99 F (37.2 C) (Oral)  Resp 22  Ht 5\' 2"  (1.575 m)  Wt 220 lb (99.791 kg)  BMI 40.23 kg/m2  SpO2 100%  LMP 04/10/2014 Physical Exam  Nursing note and vitals reviewed. Constitutional: She is oriented to person, place, and time. She appears well-developed and well-nourished. No distress.  HENT:  Head: Normocephalic and atraumatic.  Mouth/Throat: Oropharynx is clear and moist.  Eyes: EOM are normal. Pupils are equal, round, and reactive to light.  Neck: Normal range of motion. Neck supple.  Cardiovascular: Normal rate and regular rhythm.   Pulmonary/Chest: Effort normal and breath sounds normal. No respiratory distress. She has no wheezes. She has no rales.  Abdominal: Soft. Bowel sounds are normal. She exhibits no distension and no mass. There is tenderness. There is no rebound and no guarding.  She has pinpoint tenderness to palpation in the left lower quadrant left adnexal region. There is no rebound or guarding.  Musculoskeletal: Normal range of motion. She exhibits no edema and no tenderness.  Neurological: She is alert and oriented to person, place,  and time.  Skin: Skin is warm and dry. No rash noted. No erythema.  Psychiatric: She has a normal mood and affect. Her behavior is normal.    ED Course  Procedures (including critical care time) Labs Review Labs Reviewed  CBC WITH DIFFERENTIAL - Abnormal; Notable for the following:    Lymphs Abs 4.1 (*)    All other components within normal limits  COMPREHENSIVE METABOLIC PANEL - Abnormal; Notable for the following:    Glucose, Bld 105 (*)    Albumin 3.1 (*)    Total Bilirubin <0.2 (*)    GFR calc non Af Amer 88 (*)    All other components within normal  limits  URINALYSIS, ROUTINE W REFLEX MICROSCOPIC - Abnormal; Notable for the following:    Hgb urine dipstick LARGE (*)    All other components within normal limits  URINE MICROSCOPIC-ADD ON  POC URINE PREG, ED   Imaging Review No results found.   EKG Interpretation None      MDM   Final diagnoses:  None   Pain is improved with pain medication. No acute findings other than several small cysts on the left ovary. Patient states his pain is similar to past ovarian cysts. Advised to followup with OB/GYN.      Loren Raceravid Amillion [place], MD 04/11/14 [phone]

## 2014-04-11 NOTE — ED Notes (Signed)
Pt states she just started her cycle and is having lower left abd pain that is similar to her ovarian cyst pain in the past.

## 2014-05-13 IMAGING — CT CT ABD-PELV W/O CM
1 of 2 series · 15 of 32 positions shown, 19 images · non-contrast
Comparison: None

REASON FOR EXAM: (1) rlq pain; (2) rlq pain
COMMENTS:

PROCEDURE:     CT  - CT ABDOMEN AND PELVIS W[DATE]  [DATE]
RESULT:     Indication: Right lower quadrant pain
TECHNIQUE: Multiple axial images from the lung bases to the symphysis pubis
were obtained without oral and without intravenous contrast.

[Series 2: 3mm soft tissue · axial · 0.68mm/px · z∈[-482,-32]mm · 15 of 164 slices shown, 19 images]
[im 7/164  soft-tissue]
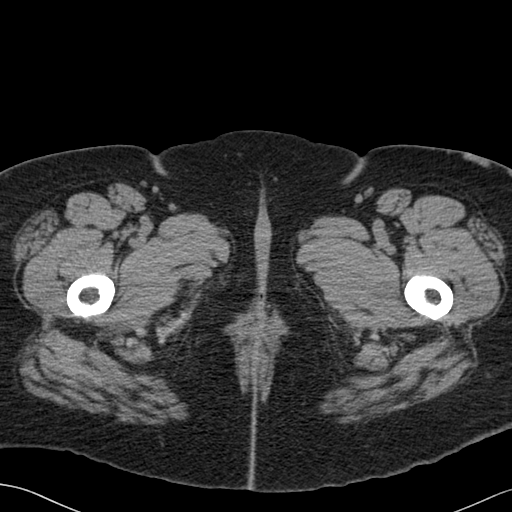
[im 7/164  bone]
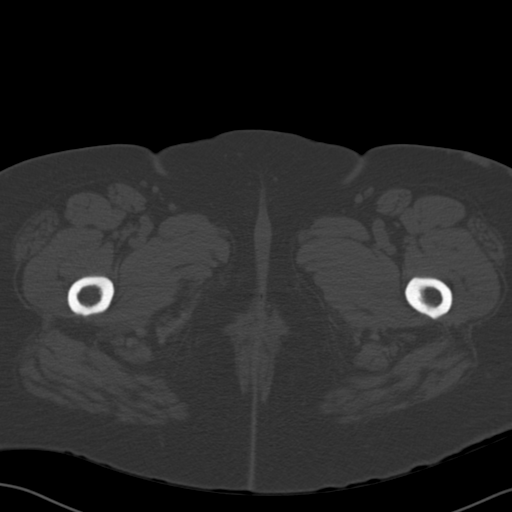
[im 21/164  soft-tissue]
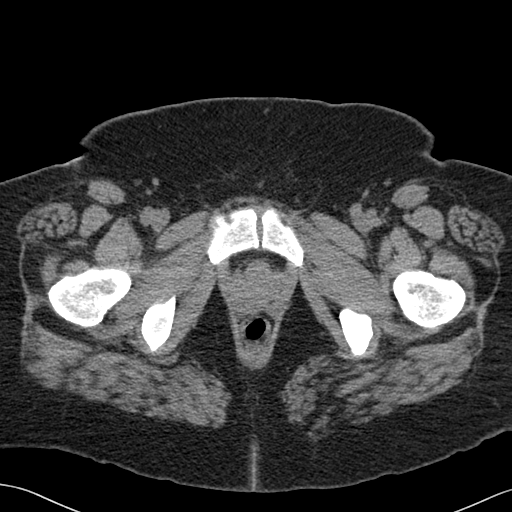
[im 34/164  soft-tissue]
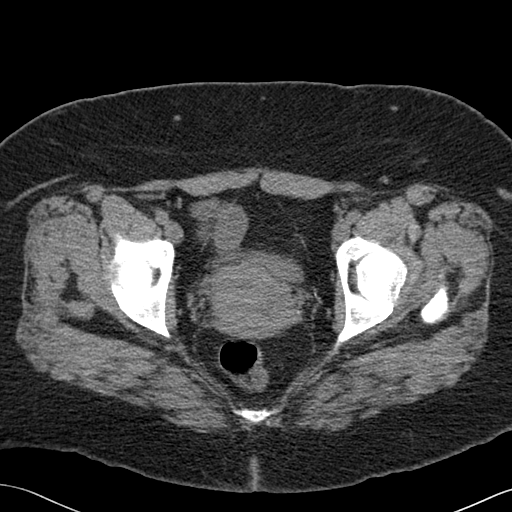
[im 48/164  soft-tissue]
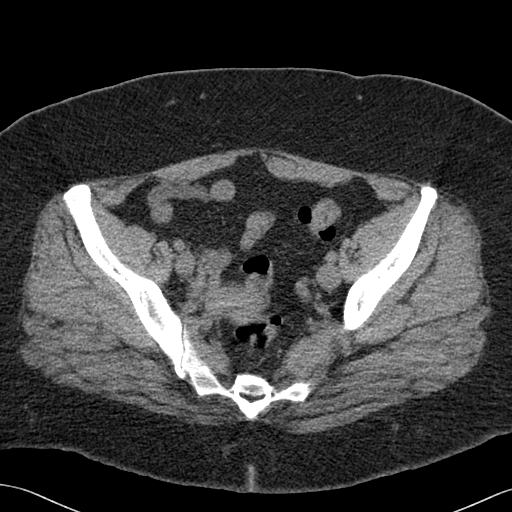
[im 55/164  soft-tissue]
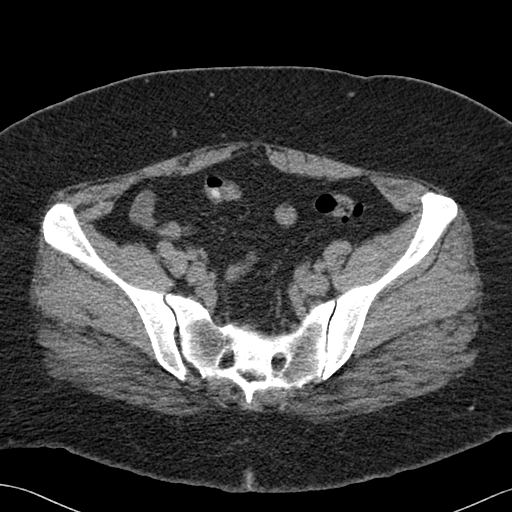
[im 68/164  soft-tissue]
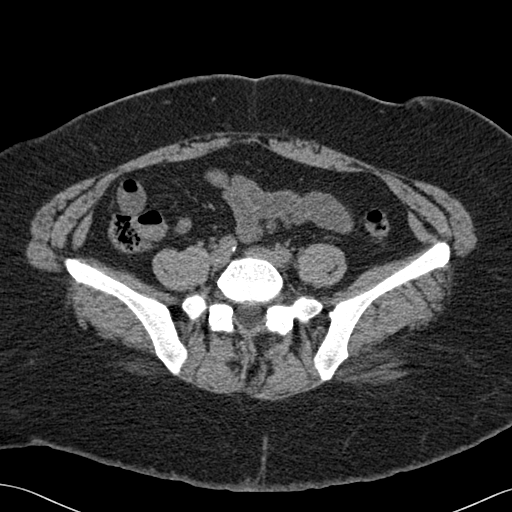
[im 82/164  soft-tissue]
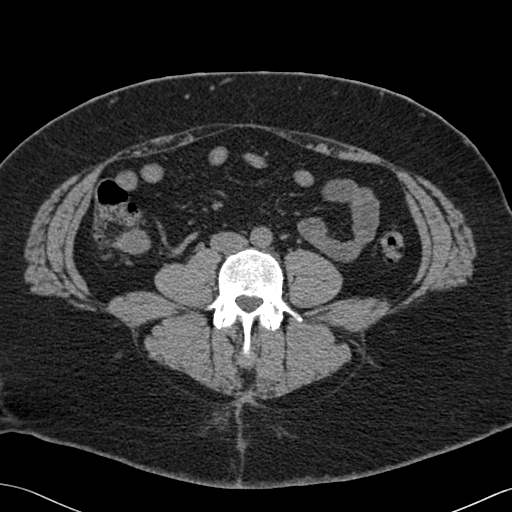
[im 96/164  soft-tissue]
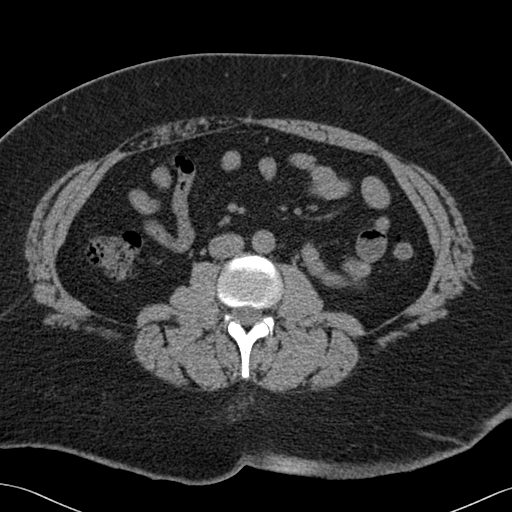
[im 109/164  soft-tissue]
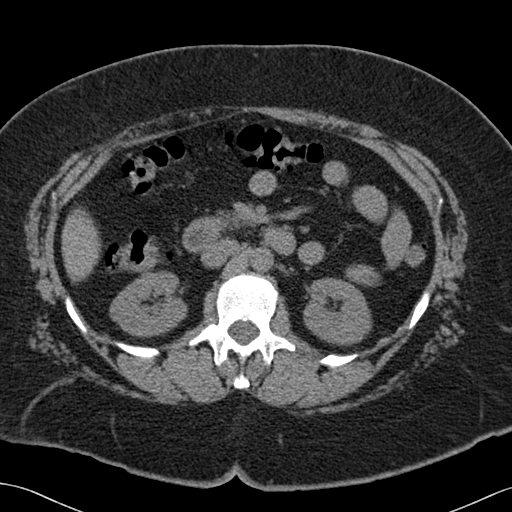
[im 109/164  bone]
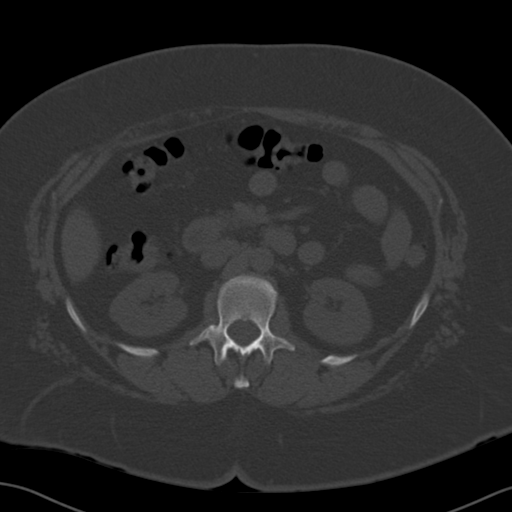
[im 116/164  soft-tissue]
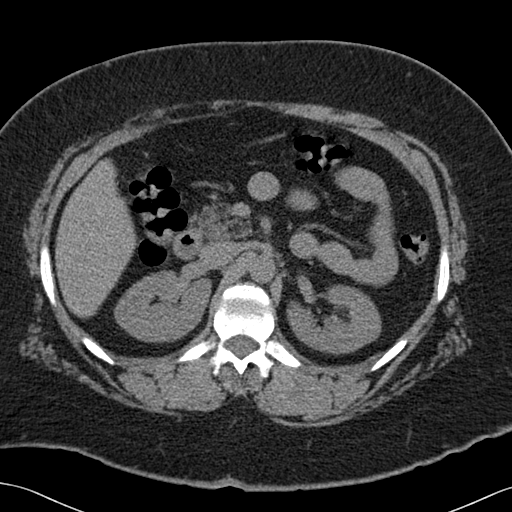
[im 130/164  soft-tissue]
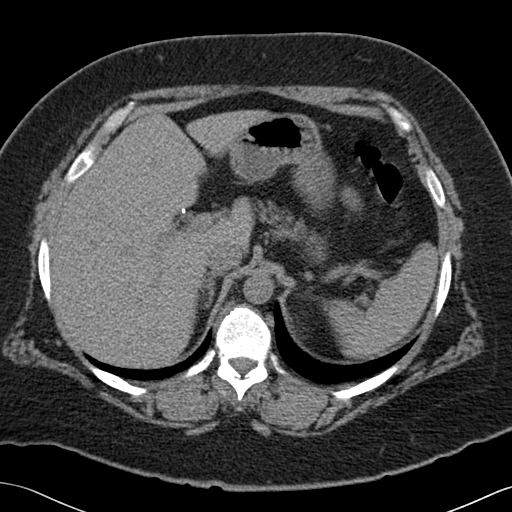
[im 136/164  lung]
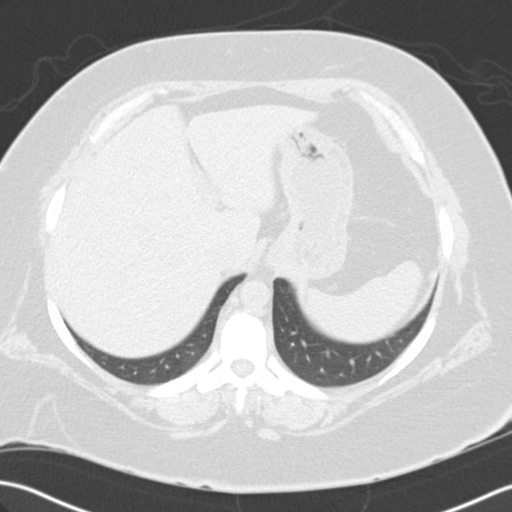
[im 143/164  soft-tissue]
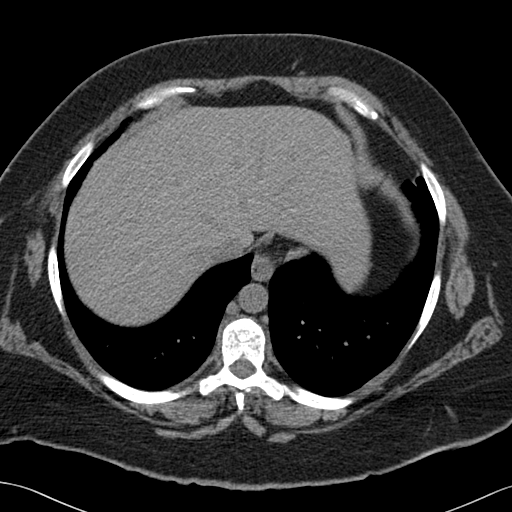
[im 143/164  lung]
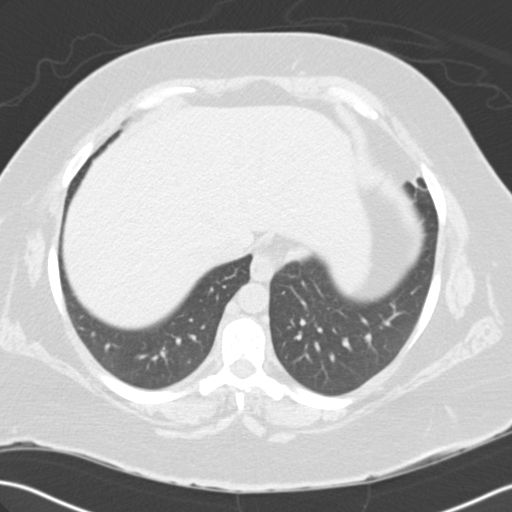
[im 150/164  lung]
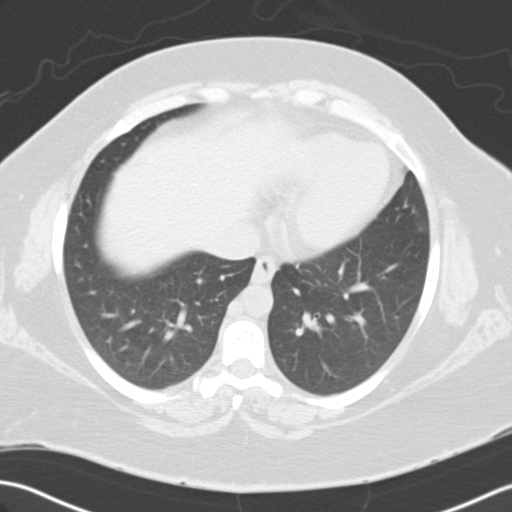
[im 157/164  soft-tissue]
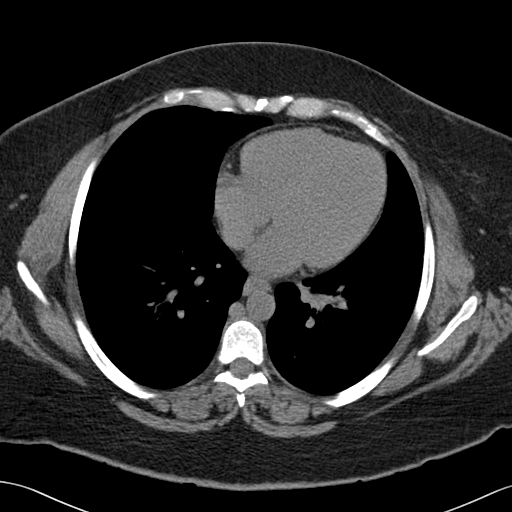
[im 157/164  lung]
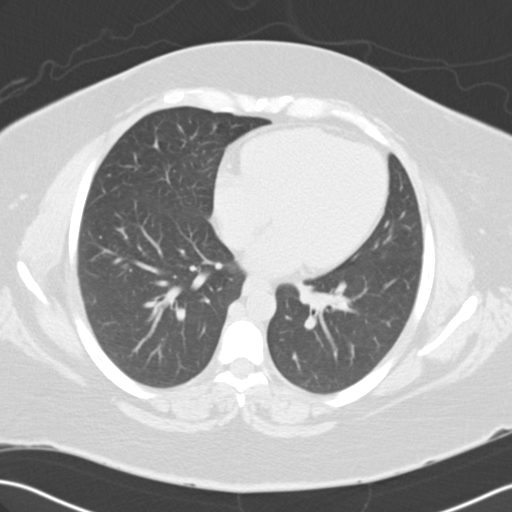

[15 of 32 positions shown; findings below may reference images not displayed]

FINDINGS: The lung bases are clear. There is no pleural or pericardial effusions.

No renal, ureteral, or bladder calculi. No obstructive uropathy. No
perinephric stranding is seen. The kidneys are symmetric in size without
evidence for exophytic mass. The bladder is unremarkable.

The liver demonstrates no focal abnormality. The gallbladder is surgically
absent. The spleen demonstrates no focal abnormality. The adrenal glands and
pancreas are normal.

The unopacified stomach, duodenum, small intestine, and large intestine are
unremarkable, but evaluation is limited by lack of oral contrast. There is a
normal caliber appendix in the right lower quadrant without periappendiceal
inflammatory changes. There is no pneumoperitoneum, pneumatosis, or portal
venous gas. There is no abdominal or pelvic free fluid. There is no
lymphadenopathy.

The abdominal aorta is normal in caliber with atherosclerosis.

The osseous structures are unremarkable.
IMPRESSION: 1. Normal appendix.

[REDACTED]

## 2014-10-30 ENCOUNTER — Encounter (HOSPITAL_COMMUNITY): Payer: Self-pay | Admitting: Emergency Medicine

## 2015-04-14 IMAGING — US US PELVIS COMPLETE
1 series · 14 of 25 positions shown · non-contrast
Comparison: None

CLINICAL DATA: Pelvic pain

EXAM:
TRANSABDOMINAL AND TRANSVAGINAL ULTRASOUND OF PELVIS
TECHNIQUE: Both transabdominal and transvaginal ultrasound examinations of the
pelvis were performed. Transabdominal technique was performed for
global imaging of the pelvis including uterus, ovaries, adnexal
regions, and pelvic cul-de-sac. It was necessary to proceed with
endovaginal exam following the transabdominal exam to visualize the
ovaries .

[Series 1: us pelvis complete · 0.27mm/px · 14 of 63 slices shown]
[im 1/63]
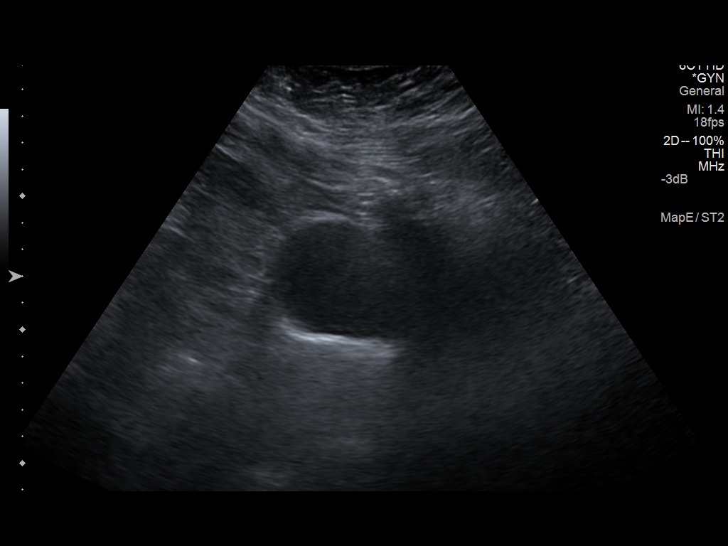
[im 6/63]
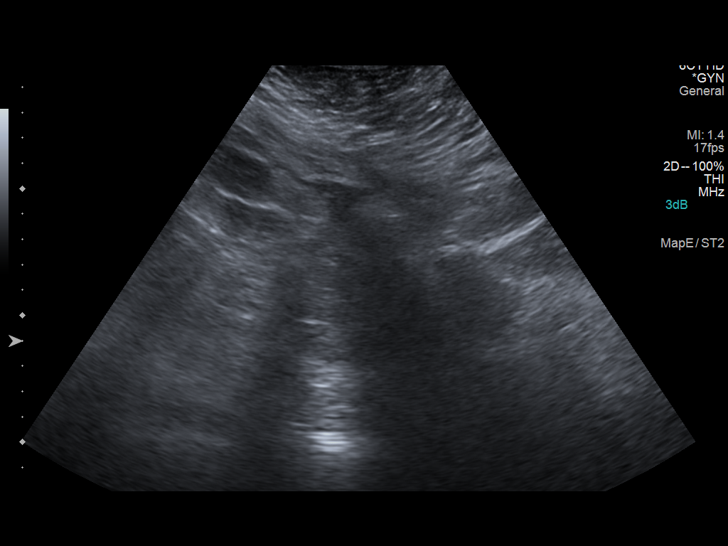
[im 11/63]
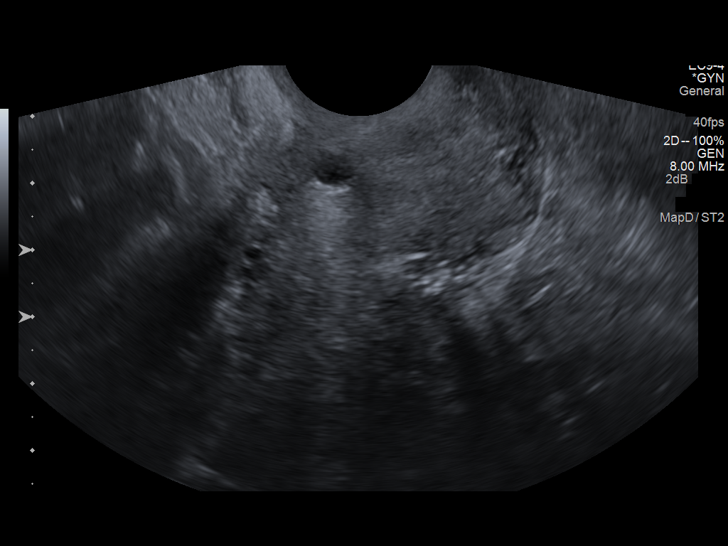
[im 16/63]
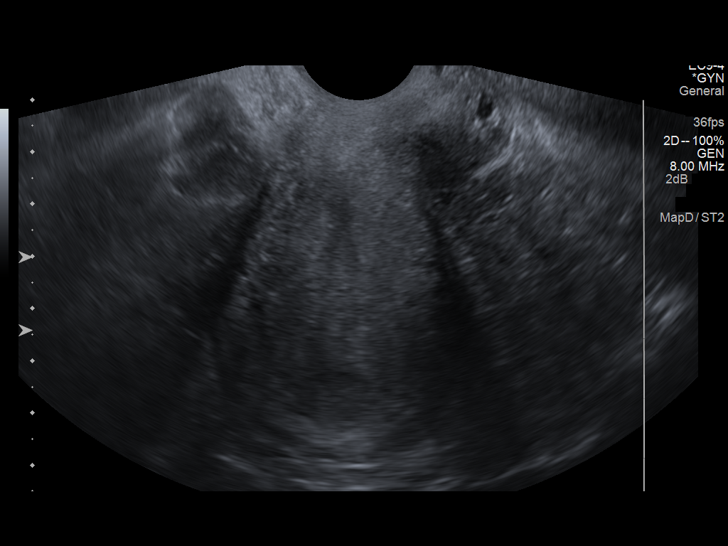
[im 21/63]
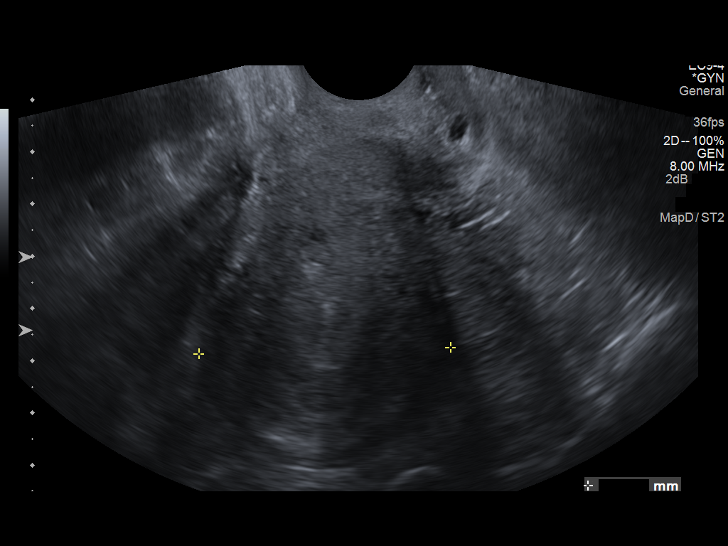
[im 24/63]
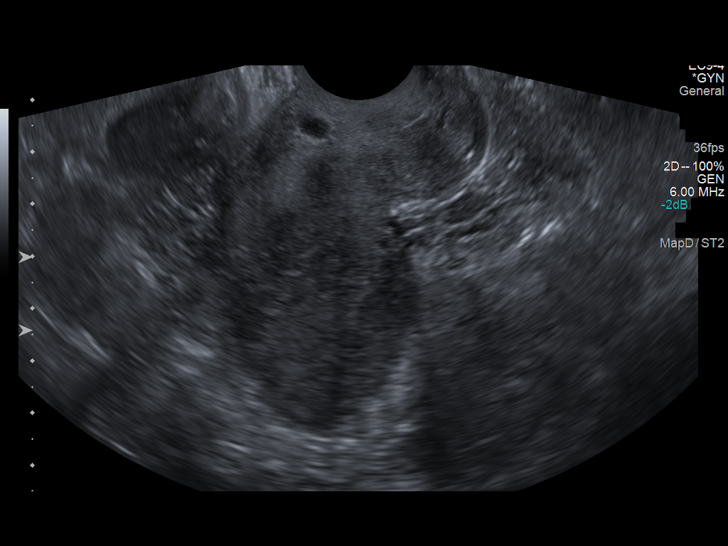
[im 29/63]
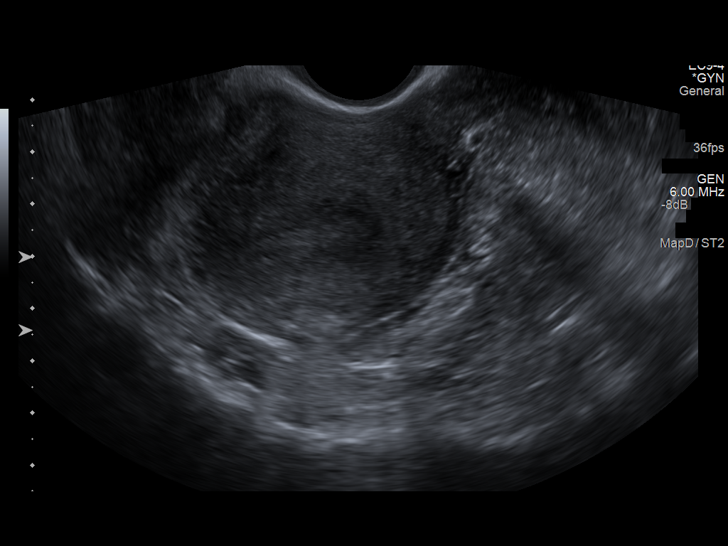
[im 34/63]
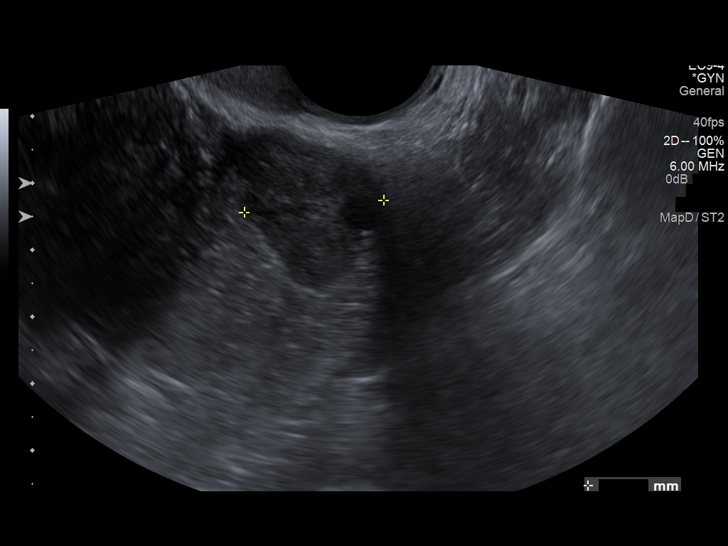
[im 39/63]
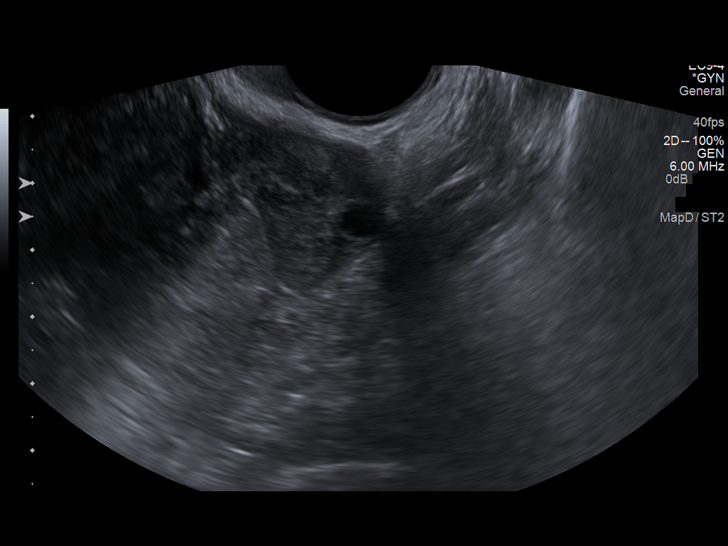
[im 42/63]
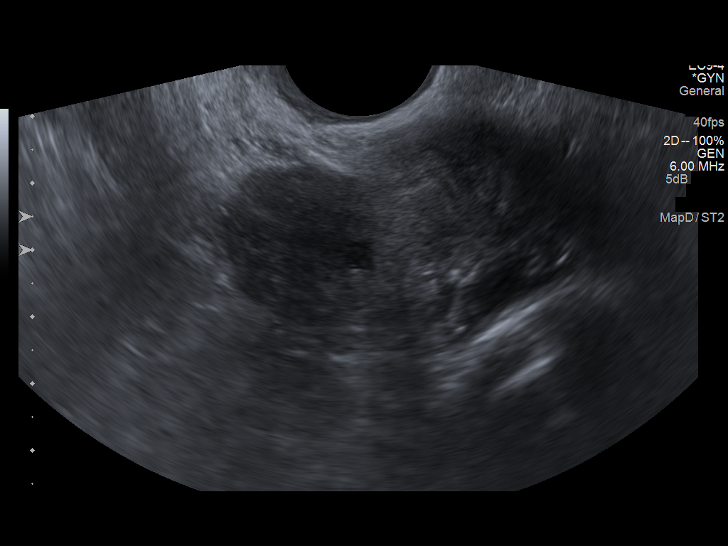
[im 47/63]
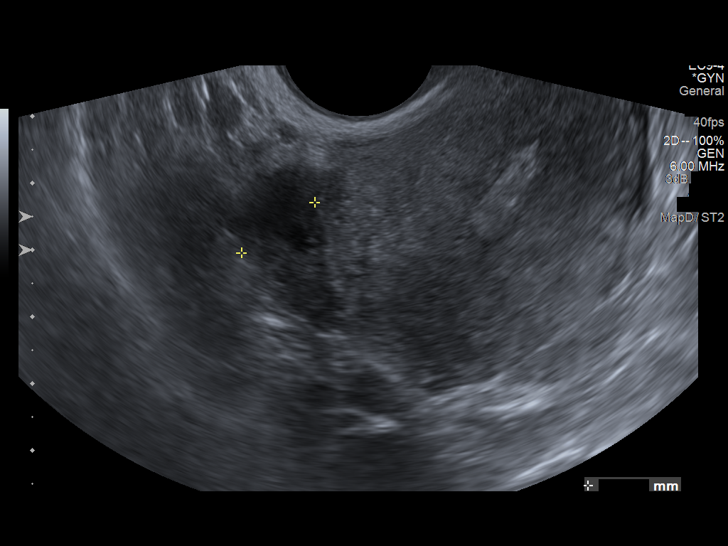
[im 52/63]
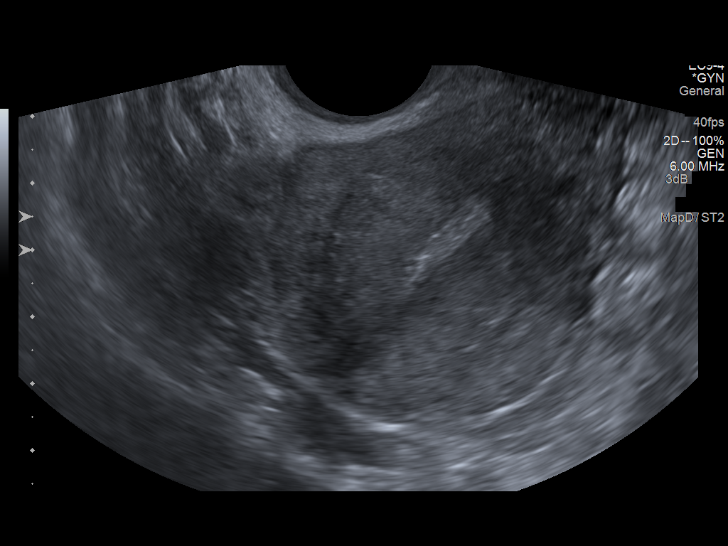
[im 57/63]
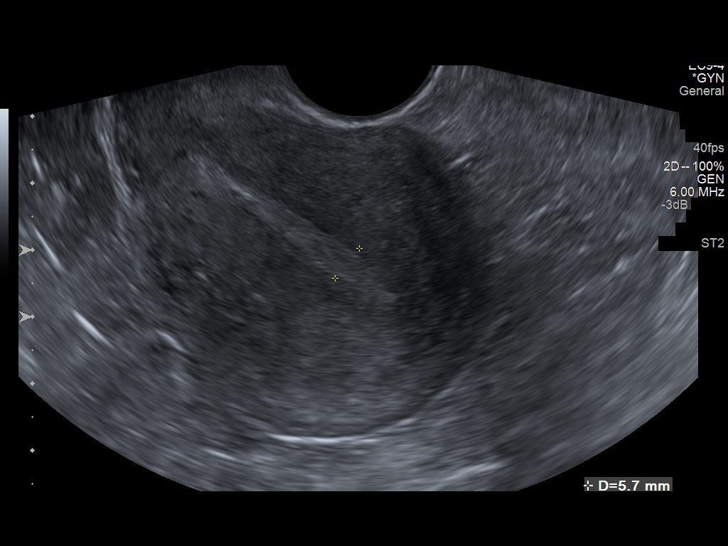
[im 63/63]
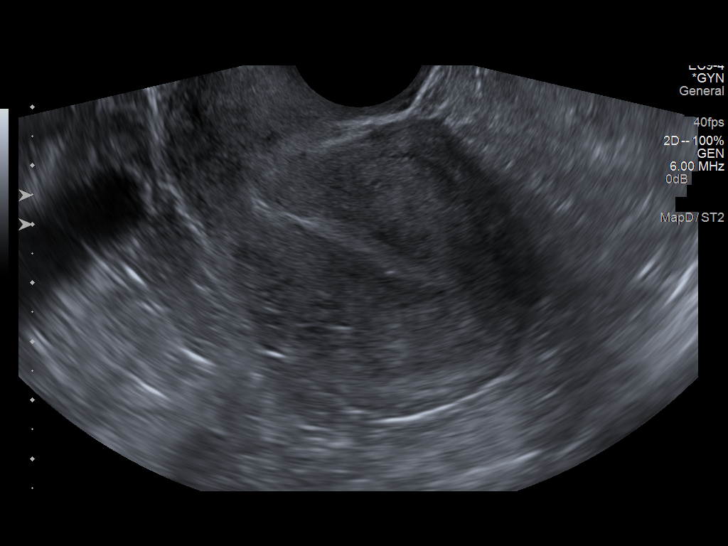

[14 of 25 positions shown; findings below may reference images not displayed]

FINDINGS: Uterus

Measurements: 8.5 x 4.8 x 5.3cm . No fibroids or other mass
visualized.

Endometrium

Thickness: 5.7 mm . No focal abnormality visualized.

Right ovary

Measurements: 2.5 x 2.1 x 1.3 cm . Normal appearance/no adnexal
mass.

Left ovary

Measurements: 2.4 x 2.4 x 2.1 cm . Normal appearance/no adnexal
mass.

Other findings

No free fluid.
IMPRESSION: Normal pelvic ultrasound.

## 2016-02-05 NOTE — Nursing Note (Signed)
Nursing Discharge Summary - Text       Nursing Discharge Summary Entered On:  02/05/2016 1:36 EST    Performed On:  02/05/2016 1:36 EST by WILL, RN, SHAYE P               DC Information   Discharge To, Anticipated :   Home independently   Mode of Discharge :   Ambulatory   Transportation :   Private vehicle   WILL, RN, Dante Gang - 02/05/2016 1:36 EST   Education   Responsible Learner(s) :   No Data Available     Barriers To Learning :   None evident   Teaching Method :   Explanation, Printed materials   WILL, RN, SHAYE P - 02/05/2016 1:36 EST   Medication Education Adult Grid   Med Dosage, Route, Scheduling :   IT sales professional, Medication :   Verbalizes understanding   WILL, RN, SHAYE P - 02/05/2016 1:36 EST

## 2016-02-22 NOTE — Nursing Note (Signed)
Nursing Discharge Summary - Text       Nursing Discharge Summary Entered On:  02/22/2016 1:35 EST    Performed On:  02/22/2016 1:35 EST by Patsi Sears, RN, Mertha Finders               DC Information   Mode of Discharge :   Ambulatory   Transportation :   Other: no distress noted at time of discharge   CANADY, RN, Mertha Finders - 02/22/2016 1:35 EST   Education   Responsible Learner(s) :   No Data Available     CANADY, RN, Mertha Finders - 02/22/2016 1:35 EST   Post-Hospital Education Adult Grid   Importance of Follow-Up Visits :   Verbalizes understanding   CANADY, RN, BROOKE N - 02/22/2016 1:35 EST
